# Patient Record
Sex: Female | Born: 2000 | Race: White | Hispanic: No | Marital: Single | Smoking: Current every day smoker
Health system: Southern US, Community
[De-identification: ages and names within clinical notes are randomized; demographics above are authoritative.]

## PROBLEM LIST (undated history)

## (undated) DIAGNOSIS — F988 Other specified behavioral and emotional disorders with onset usually occurring in childhood and adolescence: Secondary | ICD-10-CM

## (undated) DIAGNOSIS — F329 Major depressive disorder, single episode, unspecified: Secondary | ICD-10-CM

## (undated) DIAGNOSIS — F32A Depression, unspecified: Secondary | ICD-10-CM

## (undated) HISTORY — PX: NO PAST SURGERIES: SHX2092

---
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
## 2019-02-12 ENCOUNTER — Emergency Department
Admission: EM | Admit: 2019-02-12 | Discharge: 2019-02-12 | Disposition: A | Discharge status: Home / Self Care | Payer: Self-pay | Attending: Student in an Organized Health Care Education/Training Program | Admitting: Student in an Organized Health Care Education/Training Program

## 2019-02-12 ENCOUNTER — Encounter: Payer: Self-pay | Admitting: Emergency Medicine

## 2019-02-12 ENCOUNTER — Other Ambulatory Visit: Payer: Self-pay

## 2019-02-12 DIAGNOSIS — J111 Influenza due to unidentified influenza virus with other respiratory manifestations: Secondary | ICD-10-CM | POA: Insufficient documentation

## 2019-02-12 DIAGNOSIS — Z79899 Other long term (current) drug therapy: Secondary | ICD-10-CM | POA: Insufficient documentation

## 2019-02-12 DIAGNOSIS — R6889 Other general symptoms and signs: Secondary | ICD-10-CM

## 2019-02-12 DIAGNOSIS — F17200 Nicotine dependence, unspecified, uncomplicated: Secondary | ICD-10-CM | POA: Insufficient documentation

## 2019-02-12 LAB — GROUP A STREP BY PCR: Group A Strep by PCR: NOT DETECTED

## 2019-02-12 MED ORDER — BENZONATATE 100 MG PO CAPS: 200.0000 mg | ORAL_CAPSULE | Freq: Three times a day (TID) | ORAL | 0 refills | Status: AC | PRN

## 2019-02-12 MED ORDER — FEXOFENADINE-PSEUDOEPHED ER 60-120 MG PO TB12: 1.0000 | ORAL_TABLET | Freq: Two times a day (BID) | ORAL | 0 refills | Status: DC

## 2019-02-12 MED ORDER — LIDOCAINE VISCOUS HCL 2 % MT SOLN: 5.0000 mL | Freq: Four times a day (QID) | OROMUCOSAL | 0 refills | Status: DC | PRN

## 2019-02-12 NOTE — ED Triage Notes (Signed)
Presents with sore throat body aches and nausea  States sx's started last pm

## 2019-02-12 NOTE — ED Provider Notes (Signed)
[facility] Emergency Department Provider Note   ____________________________________________   First MD Initiated Contact with Patient 02/12/19 1339     (approximate)  I have reviewed the triage vital signs and the nursing notes.   HISTORY  Chief Complaint Generalized Body Aches    HPI Becky Bush is a 18 y.o. female patient presents with sore throat, body aches, and nausea.  Patient denies vomiting or diarrhea.  Onset of complaint was last night.  Patient has taken flu shot for this season.  Patient also complained of frontal headache but this is not new.  Patient rates her pain discomfort a 5/10.  No palliative measure for complaint.    Past Medical History:  Diagnosis Date  . Attention deficit disorder   . Depression     Patient Active Problem List   Diagnosis Date Noted  . Suicide attempt (HCC) 07/29/2012  . Depression 07/29/2012  . Anxiety 07/29/2012    Past Surgical History:  Procedure Laterality Date  . NO PAST SURGERIES      Prior to Admission medications   Medication Sig Start Date End Date Taking? Authorizing Provider  benzonatate (TESSALON PERLES) 100 MG capsule Take 2 capsules (200 mg total) by mouth 3 (three) times daily as needed. 02/12/19 02/12/20  Joni Reining, PA-C  citalopram (CELEXA) 20 MG tablet Take 1 tablet (20 mg total) by mouth daily after supper. 08/05/12 08/05/13  Winson, Louie Bun, NP  fexofenadine-pseudoephedrine (ALLEGRA-D) 60-120 MG 12 hr tablet Take 1 tablet by mouth 2 (two) times daily. 02/12/19   Joni Reining, PA-C  lidocaine (XYLOCAINE) 2 % solution Use as directed 5 mLs in the mouth or throat every 6 (six) hours as needed for mouth pain. 02/12/19   Joni Reining, PA-C  methylphenidate (CONCERTA) 18 MG CR tablet Take 1 tablet (18 mg total) by mouth 2 (two) times daily with breakfast and lunch. 08/05/12   Winson, Louie Bun, NP    Allergies Omnicef [cefdinir]  No family history on file.  Social  History Social History   Tobacco Use  . Smoking status: Current Every Day Smoker  . Smokeless tobacco: Never Used  Substance Use Topics  . Alcohol use: No  . Drug use: No    Review of Systems Constitutional: No fever/chills Eyes: No visual changes. ENT: Sore throat.  Nasal congestion facial pain. cardiovascular: Denies chest pain. Respiratory: Denies shortness of breath. Gastrointestinal: No abdominal pain.  No nausea, no vomiting.  No diarrhea.  No constipation. Genitourinary: Negative for dysuria. Musculoskeletal: Negative for back pain. Skin: Negative for rash. Neurological: Positive for headaches, but denies focal weakness or numbness. Psychiatric:  ADD, anxiety, and depression. Allergic/Immunilogical: Truman Hayward ____________________________________________   PHYSICAL EXAM:  VITAL SIGNS: ED Triage Vitals  Enc Vitals Group     BP 02/12/19 1320 123/74     Pulse Rate 02/12/19 1320 82     Resp 02/12/19 1320 20     Temp 02/12/19 1320 98.2 F (36.8 C)     Temp Source 02/12/19 1320 Oral     SpO2 02/12/19 1320 98 %     Weight 02/12/19 1321 120 lb (54.4 kg)     Height 02/12/19 1321 5\' 7"  (1.702 m)     Head Circumference --      Peak Flow --      Pain Score 02/12/19 1321 5     Pain Loc --      Pain Edu? --      Excl. in GC? --  Constitutional: Alert and oriented. Well appearing and in no acute distress. Eyes: Conjunctivae are normal. PERRL. EOMI. Head: Atraumatic. Nose: Left maxillary guarding and bilateral edematous nasal turbinates. Mouth/Throat: Mucous membranes are moist.  Oropharynx erythematous.  Postnasal drainage. Neck: No stridor.  Hematological/Lymphatic/Immunilogical: No cervical lymphadenopathy. Cardiovascular: Normal rate, regular rhythm. Grossly normal heart sounds.  Good peripheral circulation. Respiratory: Normal respiratory effort.  No retractions. Lungs CTAB. Gastrointestinal: Soft and nontender. No distention. No abdominal bruits. No CVA  tenderness. Genitourinary: Deferred Musculoskeletal: No lower extremity tenderness nor edema.  No joint effusions. Neurologic:  Normal speech and language. No gross focal neurologic deficits are appreciated. No gait instability. Skin:  Skin is warm, dry and intact. No rash noted. Psychiatric: Mood and affect are normal. Speech and behavior are normal.  ____________________________________________   LABS (all labs ordered are listed, but only abnormal results are displayed)  Labs Reviewed  GROUP A STREP BY PCR   ____________________________________________  EKG   ____________________________________________  RADIOLOGY  ED MD interpretation:    Official radiology report(s): No results found.  ____________________________________________   PROCEDURES  Procedure(s) performed (including Critical Care):  Procedures   ____________________________________________   INITIAL IMPRESSION / ASSESSMENT AND PLAN / ED COURSE  As part of my medical decision making, I reviewed the following data within the electronic MEDICAL RECORD NUMBER   Patient presents with sore throat, body aches and nausea.  Patient has taken a flu shot for this season.  Patient strep test was negative.  Patient physical exam is consistent with viral respiratory infection.  Patient given discharge care instruction advised take medication as directed.  Follow-up with PCP.        ____________________________________________   FINAL CLINICAL IMPRESSION(S) / ED DIAGNOSES  Final diagnoses:  Flu-like symptoms     ED Discharge Orders         Ordered    fexofenadine-pseudoephedrine (ALLEGRA-D) 60-120 MG 12 hr tablet  2 times daily     02/12/19 1448    benzonatate (TESSALON PERLES) 100 MG capsule  3 times daily PRN     02/12/19 1448    lidocaine (XYLOCAINE) 2 % solution  Every 6 hours PRN     02/12/19 1448           Note:  This document was prepared using Dragon voice recognition software and may  include unintentional dictation errors.    Joni Reining, PA-C 02/12/19 1452    Willy Eddy, MD 02/12/19 [phone]

## 2020-12-17 ENCOUNTER — Ambulatory Visit
Admission: EM | Admit: 2020-12-17 | Discharge: 2020-12-17 | Disposition: A | Discharge status: Home / Self Care | Payer: Medicaid Other | Attending: Family Medicine | Admitting: Family Medicine

## 2020-12-17 ENCOUNTER — Ambulatory Visit (INDEPENDENT_AMBULATORY_CARE_PROVIDER_SITE_OTHER): Payer: Medicaid Other

## 2020-12-17 ENCOUNTER — Other Ambulatory Visit: Payer: Self-pay

## 2020-12-17 DIAGNOSIS — R0602 Shortness of breath: Secondary | ICD-10-CM

## 2020-12-17 DIAGNOSIS — Z20822 Contact with and (suspected) exposure to covid-19: Secondary | ICD-10-CM | POA: Insufficient documentation

## 2020-12-17 DIAGNOSIS — J40 Bronchitis, not specified as acute or chronic: Secondary | ICD-10-CM | POA: Diagnosis not present

## 2020-12-17 DIAGNOSIS — R059 Cough, unspecified: Secondary | ICD-10-CM

## 2020-12-17 DIAGNOSIS — R0981 Nasal congestion: Secondary | ICD-10-CM | POA: Diagnosis not present

## 2020-12-17 DIAGNOSIS — F17200 Nicotine dependence, unspecified, uncomplicated: Secondary | ICD-10-CM | POA: Diagnosis not present

## 2020-12-17 DIAGNOSIS — F419 Anxiety disorder, unspecified: Secondary | ICD-10-CM | POA: Diagnosis not present

## 2020-12-17 DIAGNOSIS — Z881 Allergy status to other antibiotic agents status: Secondary | ICD-10-CM | POA: Diagnosis not present

## 2020-12-17 DIAGNOSIS — Z7952 Long term (current) use of systemic steroids: Secondary | ICD-10-CM | POA: Diagnosis not present

## 2020-12-17 DIAGNOSIS — Z79899 Other long term (current) drug therapy: Secondary | ICD-10-CM | POA: Insufficient documentation

## 2020-12-17 DIAGNOSIS — R052 Subacute cough: Secondary | ICD-10-CM | POA: Diagnosis not present

## 2020-12-17 DIAGNOSIS — Z283 Underimmunization status: Secondary | ICD-10-CM | POA: Insufficient documentation

## 2020-12-17 LAB — RESP PANEL BY RT-PCR (FLU A&B, COVID) ARPGX2
Influenza A by PCR: NEGATIVE
Influenza B by PCR: NEGATIVE
SARS Coronavirus 2 by RT PCR: NEGATIVE

## 2020-12-17 MED ORDER — BENZONATATE 100 MG PO CAPS: 200.0000 mg | ORAL_CAPSULE | Freq: Three times a day (TID) | ORAL | 0 refills | Status: DC

## 2020-12-17 MED ORDER — PROMETHAZINE-DM 6.25-15 MG/5ML PO SYRP: 5.0000 mL | ORAL_SOLUTION | Freq: Four times a day (QID) | ORAL | 0 refills | Status: DC | PRN

## 2020-12-17 NOTE — ED Triage Notes (Signed)
Pt is here with a cough and nasal congestion that started 6 weeks ago, pt has taken OTC meds and steriods to relieve discomfort. Pt states she has been to the doctor multiple times.

## 2020-12-17 NOTE — Discharge Instructions (Addendum)
Your test were negative today for Covid, flu, and your chest x-ray did not show any signs of pneumonia.  Continue to use albuterol inhaler as needed for any cough and shortness of breath.  Use the Tessalon Perles every 8 hours during the day as needed for cough and the promethazine DM cough syrup at bedtime as needed for cough and sleep.  Smoking can increase lung irritation and add to your cough and shortness of breath.  If your symptoms continue you need to follow-up with your primary care provider.

## 2020-12-17 NOTE — ED Provider Notes (Signed)
[facility]    CSN: [id number] Arrival date & time: 12/17/20  0801      History   Chief Complaint Chief Complaint  Patient presents with  . Cough  . Nasal Congestion    HPI Becky Bush is a 19 y.o. female.   HPI   19 year old female here for evaluation of cough and nasal congestion.  Patient reports that she lives in [place] and is appear visiting.  Patient reports that she has seen her physician in [place] 3 times for her symptoms and she has been diagnosed with bronchitis.  She was given albuterol inhaler which she says helps minimally and Tessalon Perles which she is out of.  Patient states that she was told that she had fluid in her lungs based on her physical exam but no chest x-ray was performed.  Patient is complaining of an intermittent productive cough, shortness of breath, wheezing, trouble sleeping due to her coughing, and a runny nose.  Patient denies fever, sinus pain or pressure, ear pain or pressure, changes to her sense of taste or smell, sore throat, or body aches.  Patient has not had her Covid or flu vaccines.  Patient does smoke marijuana 3-4 times a week for anxiety.  Patient had been vaping but states she stopped.  Past Medical History:  Diagnosis Date  . Attention deficit disorder   . Depression     Patient Active Problem List   Diagnosis Date Noted  . Suicide attempt (HCC) 07/29/2012  . Depression 07/29/2012  . Anxiety 07/29/2012    Past Surgical History:  Procedure Laterality Date  . NO PAST SURGERIES      OB History   No obstetric history on file.      Home Medications    Prior to Admission medications   Medication Sig Start Date End Date Taking? Authorizing Provider  benzonatate (TESSALON) 100 MG capsule Take 2 capsules (200 mg total) by mouth every 8 (eight) hours. 12/17/20  Yes Becky Augusta, NP  promethazine-dextromethorphan (PROMETHAZINE-DM) 6.25-15 MG/5ML syrup Take 5 mLs by mouth 4 (four) times  daily as needed. 12/17/20  Yes Becky Augusta, NP  citalopram (CELEXA) 20 MG tablet Take 1 tablet (20 mg total) by mouth daily after supper. 08/05/12 08/05/13  Winson, Louie Bun, NP  fexofenadine-pseudoephedrine (ALLEGRA-D) 60-120 MG 12 hr tablet Take 1 tablet by mouth 2 (two) times daily. 02/12/19   Joni Reining, PA-C  levofloxacin (LEVAQUIN) 750 MG tablet Take 750 mg by mouth daily. 09/23/20   [provider]  lidocaine (XYLOCAINE) 2 % solution Use as directed 5 mLs in the mouth or throat every 6 (six) hours as needed for mouth pain. 02/12/19   Joni Reining, PA-C  methylphenidate (CONCERTA) 18 MG CR tablet Take 1 tablet (18 mg total) by mouth 2 (two) times daily with breakfast and lunch. 08/05/12   Winson, Louie Bun, NP  predniSONE (STERAPRED UNI-PAK 48 TAB) 10 MG (48) TBPK tablet Take by mouth. 09/23/20   [provider]  PROAIR HFA 108 (90 Base) MCG/ACT inhaler Inhale into the lungs. 12/10/20   [provider]    Family History Family History  Problem Relation Age of Onset  . Healthy Mother   . Healthy Father     Social History Social History   Tobacco Use  . Smoking status: Current Every Day Smoker  . Smokeless tobacco: Never Used  Vaping Use  . Vaping Use: Former  Substance Use Topics  . Alcohol use: No  .  Drug use: Yes    Types: Marijuana     Allergies   Omnicef [cefdinir]   Review of Systems Review of Systems  Constitutional: Negative for activity change, appetite change and fever.  HENT: Positive for congestion and rhinorrhea. Negative for ear pain, sinus pressure, sinus pain and sore throat.   Respiratory: Positive for cough, shortness of breath and wheezing.   Cardiovascular: Negative for chest pain.  Musculoskeletal: Negative for arthralgias and myalgias.  Skin: Negative for rash.  Hematological: Negative.   Psychiatric/Behavioral: Negative.      Physical Exam Triage Vital Signs ED Triage Vitals  Enc Vitals Group     BP 12/17/20 0809  (!) 129/96     Pulse Rate 12/17/20 0809 (!) 116     Resp 12/17/20 0809 16     Temp 12/17/20 0809 98.2 F (36.8 C)     Temp Source 12/17/20 0809 Oral     SpO2 12/17/20 0809 96 %     Weight --      Height --      Head Circumference --      Peak Flow --      Pain Score 12/17/20 0808 0     Pain Loc --      Pain Edu? --      Excl. in GC? --    No data found.  Updated Vital Signs BP (!) 129/96 (BP Location: Left Arm)   Pulse (!) 116   Temp 98.2 F (36.8 C) (Oral)   Resp 16   SpO2 96%   Visual Acuity Right Eye Distance:   Left Eye Distance:   Bilateral Distance:    Right Eye Near:   Left Eye Near:    Bilateral Near:     Physical Exam Vitals and nursing note reviewed.  Constitutional:      General: She is not in acute distress.    Appearance: She is normal weight. She is not ill-appearing.  HENT:     Head: Normocephalic and atraumatic.     Right Ear: Tympanic membrane, ear canal and external ear normal.     Left Ear: Tympanic membrane, ear canal and external ear normal.     Nose: Congestion and rhinorrhea present.     Comments: Patient has mild erythema and edema to her nasal mucosa bilaterally.  There is clear nasal discharge present in scant amounts bilaterally.    Mouth/Throat:     Mouth: Mucous membranes are moist.     Pharynx: Oropharynx is clear. No oropharyngeal exudate or posterior oropharyngeal erythema.  Neck:     Comments: Patient has bilateral, anterior, shotty, nontender cervical lymphadenopathy. Cardiovascular:     Rate and Rhythm: Normal rate and regular rhythm.     Pulses: Normal pulses.     Heart sounds: Normal heart sounds. No murmur heard. No gallop.   Pulmonary:     Effort: Pulmonary effort is normal.     Breath sounds: Normal breath sounds. No wheezing, rhonchi or rales.  Musculoskeletal:     Cervical back: Normal range of motion and neck supple.  Lymphadenopathy:     Cervical: Cervical adenopathy present.  Skin:    General: Skin is warm  and dry.     Capillary Refill: Capillary refill takes less than 2 seconds.     Findings: No erythema or rash.  Neurological:     General: No focal deficit present.     Mental Status: She is alert and oriented to person, place, and time.  Psychiatric:  Mood and Affect: Mood normal.        Behavior: Behavior normal.        Thought Content: Thought content normal.        Judgment: Judgment normal.      UC Treatments / Results  Labs (all labs ordered are listed, but only abnormal results are displayed) Labs Reviewed  RESP PANEL BY RT-PCR (FLU A&B, COVID) [id number]    EKG   Radiology DG Chest 2 View  Result Date: 12/17/2020 CLINICAL DATA:  Shortness of breath.  Cough. EXAM: CHEST - 2 VIEW COMPARISON:  May 13, 2015. FINDINGS: The heart size and mediastinal contours are within normal limits. Both lungs are clear. No pneumothorax or pleural effusion is noted. The visualized skeletal structures are unremarkable. IMPRESSION: No active cardiopulmonary disease. Electronically Signed   By: Lupita Raider M.D.   On: 12/17/2020 08:49    Procedures Procedures (including critical care time)  Medications Ordered in UC Medications - No data to display  Initial Impression / Assessment and Plan / UC Course  I have reviewed the triage vital signs and the nursing notes.  Pertinent labs & imaging results that were available during my care of the patient were reviewed by me and considered in my medical decision making (see chart for details).   Patient is here for evaluation of cough this been going on for 6 weeks.  She has been treated with prednisone, Tessalon Perles, and albuterol without resolution or improvement.  Physical exam reveals a nontoxic 19 year old female who is not tachypneic, dyspneic, or in any acute distress.  Upper respiratory exam reveals some mildly erythematous and edematous nasal mucosa with clear nasal discharge.  Patient also has bilateral cervical lymphadenopathy.   Lung sounds are clear to auscultation in all fields.  Will obtain chest x-ray and respiratory panel collected at triage.  Chest x-ray is negative for any acute cardiopulmonary process.  Gwyndolyn Kaufman panel is negative for Covid and flu.  Will discharge patient home with a diagnosis of cough, will refill the Tessalon Perles, will give Promethazine DM for nighttime to help with cough and sleep, encourage her to continue using her albuterol inhaler, and stop smoking.    Final Clinical Impressions(s) / UC Diagnoses   Final diagnoses:  Cough     Discharge Instructions     Your test were negative today for Covid, flu, and your chest x-ray did not show any signs of pneumonia.  Continue to use albuterol inhaler as needed for any cough and shortness of breath.  Use the Tessalon Perles every 8 hours during the day as needed for cough and the promethazine DM cough syrup at bedtime as needed for cough and sleep.  Smoking can increase lung irritation and add to your cough and shortness of breath.  If your symptoms continue you need to follow-up with your primary care provider.    ED Prescriptions    Medication Sig Dispense Auth. Provider   benzonatate (TESSALON) 100 MG capsule Take 2 capsules (200 mg total) by mouth every 8 (eight) hours. 21 capsule Becky Augusta, NP   promethazine-dextromethorphan (PROMETHAZINE-DM) 6.25-15 MG/5ML syrup Take 5 mLs by mouth 4 (four) times daily as needed. 118 mL Becky Augusta, NP     PDMP not reviewed this encounter.   Becky Augusta, NP 12/17/20 1017

## 2021-03-01 ENCOUNTER — Observation Stay
Admission: EM | Admit: 2021-03-01 | Discharge: 2021-03-02 | Disposition: A | Discharge status: Home / Self Care | Payer: Medicaid Other | Attending: Internal Medicine | Admitting: Internal Medicine

## 2021-03-01 ENCOUNTER — Other Ambulatory Visit: Payer: Self-pay

## 2021-03-01 ENCOUNTER — Emergency Department: Payer: Medicaid Other

## 2021-03-01 ENCOUNTER — Encounter: Payer: Self-pay | Admitting: Emergency Medicine

## 2021-03-01 ENCOUNTER — Other Ambulatory Visit
Admission: RE | Admit: 2021-03-01 | Discharge: 2021-03-01 | Disposition: A | Discharge status: Home / Self Care | Payer: Medicaid Other | Source: Ambulatory Visit | Attending: *Deleted | Admitting: *Deleted

## 2021-03-01 DIAGNOSIS — J45901 Unspecified asthma with (acute) exacerbation: Principal | ICD-10-CM | POA: Insufficient documentation

## 2021-03-01 DIAGNOSIS — R079 Chest pain, unspecified: Secondary | ICD-10-CM | POA: Insufficient documentation

## 2021-03-01 DIAGNOSIS — R42 Dizziness and giddiness: Secondary | ICD-10-CM | POA: Insufficient documentation

## 2021-03-01 DIAGNOSIS — F17200 Nicotine dependence, unspecified, uncomplicated: Secondary | ICD-10-CM | POA: Insufficient documentation

## 2021-03-01 DIAGNOSIS — Z20822 Contact with and (suspected) exposure to covid-19: Secondary | ICD-10-CM | POA: Insufficient documentation

## 2021-03-01 DIAGNOSIS — R0602 Shortness of breath: Secondary | ICD-10-CM | POA: Insufficient documentation

## 2021-03-01 DIAGNOSIS — R000 Tachycardia, unspecified: Secondary | ICD-10-CM

## 2021-03-01 DIAGNOSIS — R059 Cough, unspecified: Secondary | ICD-10-CM | POA: Insufficient documentation

## 2021-03-01 DIAGNOSIS — J40 Bronchitis, not specified as acute or chronic: Secondary | ICD-10-CM | POA: Diagnosis not present

## 2021-03-01 LAB — RESP PANEL BY RT-PCR (FLU A&B, COVID) ARPGX2
Influenza A by PCR: NEGATIVE
Influenza B by PCR: NEGATIVE
SARS Coronavirus 2 by RT PCR: NEGATIVE

## 2021-03-01 LAB — URINE DRUG SCREEN, QUALITATIVE (ARMC ONLY)
Amphetamines, Ur Screen: NOT DETECTED
Barbiturates, Ur Screen: NOT DETECTED
Benzodiazepine, Ur Scrn: NOT DETECTED
Cannabinoid 50 Ng, Ur ~~LOC~~: POSITIVE — AB
Cocaine Metabolite,Ur ~~LOC~~: NOT DETECTED
MDMA (Ecstasy)Ur Screen: NOT DETECTED
Methadone Scn, Ur: NOT DETECTED
Opiate, Ur Screen: NOT DETECTED
Phencyclidine (PCP) Ur S: NOT DETECTED
Tricyclic, Ur Screen: NOT DETECTED

## 2021-03-01 LAB — TROPONIN I (HIGH SENSITIVITY)
Troponin I (High Sensitivity): <2 ng/L (ref <18)
Troponin I (High Sensitivity): <2 ng/L (ref <18)

## 2021-03-01 LAB — D-DIMER, QUANTITATIVE
D-Dimer, Quant: <0.27 ug/mL-FEU (ref 0.00–0.50)
D-Dimer, Quant: <0.27 ug/mL-FEU (ref 0.00–0.50)

## 2021-03-01 MED ORDER — PREDNISONE 20 MG PO TABS
40.0000 mg | ORAL_TABLET | Freq: Every day | ORAL | Status: DC
  Administered 2021-03-02: 40 mg via ORAL
  Filled 2021-03-01: qty 2

## 2021-03-01 MED ORDER — TRAZODONE HCL 50 MG PO TABS: 50.0000 mg | ORAL_TABLET | Freq: Every day | ORAL | Status: DC

## 2021-03-01 MED ORDER — IPRATROPIUM BROMIDE 0.02 % IN SOLN
0.5000 mg | Freq: Four times a day (QID) | RESPIRATORY_TRACT | Status: DC | PRN
  Administered 2021-03-02: 0.5 mg via RESPIRATORY_TRACT
  Filled 2021-03-01: qty 2.5

## 2021-03-01 MED ORDER — LEVALBUTEROL HCL 1.25 MG/0.5ML IN NEBU
1.2500 mg | INHALATION_SOLUTION | Freq: Four times a day (QID) | RESPIRATORY_TRACT | Status: DC | PRN
  Administered 2021-03-02: 1.25 mg via RESPIRATORY_TRACT
  Filled 2021-03-01 (×2): qty 0.5

## 2021-03-01 MED ORDER — HYDROXYZINE HCL 25 MG PO TABS
25.0000 mg | ORAL_TABLET | Freq: Once | ORAL | Status: AC
  Administered 2021-03-01: 25 mg via ORAL
  Filled 2021-03-01: qty 1

## 2021-03-01 MED ORDER — IOHEXOL 350 MG/ML SOLN
75.0000 mL | Freq: Once | INTRAVENOUS | Status: AC | PRN
  Administered 2021-03-01: 75 mL via INTRAVENOUS

## 2021-03-01 MED ORDER — IPRATROPIUM-ALBUTEROL 0.5-2.5 (3) MG/3ML IN SOLN
3.0000 mL | Freq: Once | RESPIRATORY_TRACT | Status: AC
  Administered 2021-03-01: 3 mL via RESPIRATORY_TRACT
  Filled 2021-03-01: qty 3

## 2021-03-01 MED ORDER — SODIUM CHLORIDE 0.9 % IV BOLUS
500.0000 mL | Freq: Once | INTRAVENOUS | Status: AC
  Administered 2021-03-01: 500 mL via INTRAVENOUS

## 2021-03-01 MED ORDER — METHYLPREDNISOLONE SODIUM SUCC 125 MG IJ SOLR: 125.0000 mg | Freq: Once | INTRAMUSCULAR | Status: DC

## 2021-03-01 MED ORDER — METOPROLOL TARTRATE 5 MG/5ML IV SOLN
5.0000 mg | Freq: Once | INTRAVENOUS | Status: AC
  Administered 2021-03-01: 5 mg via INTRAVENOUS
  Filled 2021-03-01: qty 5

## 2021-03-01 MED ORDER — ENOXAPARIN SODIUM 40 MG/0.4ML ~~LOC~~ SOLN
40.0000 mg | SUBCUTANEOUS | Status: DC
  Administered 2021-03-01: 40 mg via SUBCUTANEOUS
  Filled 2021-03-01: qty 0.4

## 2021-03-01 NOTE — ED Triage Notes (Addendum)
Pt via POV from home. Pt went to [facility] due to SOB that the pt states has been going on for over a year. Pt also c/o productive cough. Pt has a hx of asthma. Pt states that [facility] gave her 2 neb treatments and Solu-Medrol. Pt states she feels slightly better. On arrival, pt O2 sat 87% on RA and HR is 136. Pt does have a hx of Vaping but has quit for about couple months

## 2021-03-01 NOTE — ED Notes (Signed)
Pt placed in the room. During triage pt O2 sat 95% on 3L [place].

## 2021-03-01 NOTE — ED Provider Notes (Signed)
[facility] Emergency Department Provider Note ____________________________________________   Event Date/Time   First MD Initiated Contact with Patient 03/01/21 1507     (approximate)  I have reviewed the triage vital signs and the nursing notes.   HISTORY  Chief Complaint Shortness of Breath and Cough    HPI Becky Bush is a 20 y.o. female with PMH as noted below who presents with shortness of breath over approximately the last year, but worse in the last several weeks.  The patient states that she has had periods of shortness of breath and cough and then would be prescribed a steroid course and albuterol and it would improve.  She reports cough productive of greenish sputum.  She has had no fever.  She denies active chest pain.  The patient states that she went to urgent care today and got nebulizer treatments and a shot and then was sent to the ED.  She states that the symptoms started after she had been vaping, however she has not used a vape in the last several months.  Past Medical History:  Diagnosis Date  . Attention deficit disorder   . Depression     Patient Active Problem List   Diagnosis Date Noted  . Asthma exacerbation 03/01/2021  . Suicide attempt (HCC) 07/29/2012  . Depression 07/29/2012  . Anxiety 07/29/2012    Past Surgical History:  Procedure Laterality Date  . NO PAST SURGERIES      Prior to Admission medications   Medication Sig Start Date End Date Taking? Authorizing Provider  predniSONE (STERAPRED UNI-PAK 21 TAB) 10 MG (21) TBPK tablet Take 10-60 mg by mouth daily. 03/01/21   [provider]  PROAIR HFA 108 (90 Base) MCG/ACT inhaler Inhale into the lungs. 12/10/20   [provider]    Allergies Omnicef [cefdinir]  Family History  Problem Relation Age of Onset  . Healthy Mother   . Healthy Father     Social History Social History   Tobacco Use  . Smoking status: Current Every Day Smoker   . Smokeless tobacco: Never Used  Vaping Use  . Vaping Use: Former  Substance Use Topics  . Alcohol use: No  . Drug use: Yes    Types: Marijuana    Review of Systems  Constitutional: No fever/chills Eyes: No redness.. ENT: No sore throat. Cardiovascular: Denies chest pain. Respiratory: Positive for shortness of breath. Gastrointestinal: No vomiting or diarrhea.  Genitourinary: Negative for flank pain. Musculoskeletal: Negative for back pain. Skin: Negative for rash. Neurological: Negative for headache.   ____________________________________________   PHYSICAL EXAM:  VITAL SIGNS: ED Triage Vitals  Enc Vitals Group     BP 03/01/21 1436 113/88     Pulse Rate 03/01/21 1436 (!) 136     Resp 03/01/21 1436 20     Temp 03/01/21 1436 97.8 F (36.6 C)     Temp Source 03/01/21 1436 Oral     SpO2 03/01/21 1436 (!) 87 %     Weight 03/01/21 1439 105 lb (47.6 kg)     Height 03/01/21 1439 5\' 7"  (1.702 m)     Head Circumference --      Peak Flow --      Pain Score 03/01/21 1439 0     Pain Loc --      Pain Edu? --      Excl. in GC? --     Constitutional: Alert and oriented. Well appearing and in no acute distress. Eyes: Conjunctivae are  normal.  Head: Atraumatic. Nose: Mild nasal congestion. Mouth/Throat: Mucous membranes are moist.   Neck: Normal range of motion.  Cardiovascular: Tachycardic, regular rhythm. Grossly normal heart sounds.  Good peripheral circulation. Respiratory: Slightly increased respiratory effort.  No retractions. Lungs with faint scattered wheezes bilaterally. Gastrointestinal: No distention.  Musculoskeletal: No lower extremity edema.  Extremities warm and well perfused.  Neurologic:  Normal speech and language. No gross focal neurologic deficits are appreciated.  Skin:  Skin is warm and dry. No rash noted. Psychiatric: Anxious appearing and tearful; normal behavior.  ____________________________________________   LABS (all labs ordered are  listed, but only abnormal results are displayed)  ____________________________________________  EKG  ED ECG REPORT I, Dionne Bucy, the attending physician, personally viewed and interpreted this ECG.  Date: 03/01/2021 EKG Time: 1811 Rate: 164 Rhythm: normal sinus rhythm QRS Axis: normal Intervals: normal ST/T Wave abnormalities: Nonspecific ST abnormalities Narrative Interpretation: Sinus tachycardia with nonspecific abnormalities (likely rate related) but no evidence of acute ischemia  ____________________________________________  RADIOLOGY  Chest x-ray interpreted by me shows no focal infiltrate or edema  ____________________________________________   PROCEDURES  Procedure(s) performed: No  Procedures  Critical Care performed: No ____________________________________________   INITIAL IMPRESSION / ASSESSMENT AND PLAN / ED COURSE  Pertinent labs & imaging results that were available during my care of the patient were reviewed by me and considered in my medical decision making (see chart for details).  20 year old female with PMH as noted above including a history of asthma as a child presents with repeated episodes of shortness of breath, cough, and wheezing over the last year, with the current episode occurring over the last several weeks.  She went to urgent care and was found to be hypoxic after steroid and nebulizer treatments.  I reviewed the past medical records in Epic and Care Everywhere.  The patient was seen at urgent care today.  CBC from today is normal with no leukocytosis.  BMP is unremarkable.  Apparently a D-dimer was sent but I do not see a result.  She was last seen in urgent care on 12/31 for similar symptoms.  She is not vaccinated for Covid.  On exam the patient is somewhat anxious and tearful appearing but in no acute distress.  She is tachypneic and tachycardic.  O2 saturation is in the low to mid 90s on 2 L by nasal cannula.  Room air O2  saturation was in the high 80s.  There are some scattered wheezing on lung exam.  Differential includes acute and/or chronic bronchitis, other chronic lung disease, pneumonia, asthma, COVID-19, or less likely PE or cardiac cause.  We will obtain a chest x-ray, troponin, D-dimer, continue bronchodilators and reassess.  ----------------------------------------- 7:42 PM on 03/01/2021 -----------------------------------------  The patient's shortness of breath has significantly improved.  We were able to take her off of the oxygen, and her O2 sat is now around 93 to 94% on room air.  She was able to ambulate around the room without a significant drop in O2 saturation.  However, for the last several hours the patient has been significantly tachycardic to as high as the 160s to 170s.  EKG shows a sinus rhythm.  The patient denies any acute symptoms related to this, but who is now persisted for several hours despite an additional liter of normal saline.  Overall I suspect that this is likely related to the albuterol and should resolve, but the patient will require additional monitoring hospital.  Also despite the normal D-dimer, we will  obtain a CT angio of the chest to rule out PE as a cause of this persistent tachycardia.  I consulted Dr. Cyndia Bent from the hospitalist service for admission __________________________  Griffin Dakin was evaluated in Emergency Department on 03/01/2021 for the symptoms described in the history of present illness. She was evaluated in the context of the global COVID-19 pandemic, which necessitated consideration that the patient might be at risk for infection with the SARS-CoV-2 virus that causes COVID-19. Institutional protocols and algorithms that pertain to the evaluation of patients at risk for COVID-19 are in a state of rapid change based on information released by regulatory bodies including the CDC and federal and state organizations. These policies and algorithms were  followed during the patient's care in the ED. ____________________________________________   FINAL CLINICAL IMPRESSION(S) / ED DIAGNOSES  Final diagnoses:  Bronchitis  Sinus tachycardia      NEW MEDICATIONS STARTED DURING THIS VISIT:  New Prescriptions   No medications on file     Note:  This document was prepared using Dragon voice recognition software and may include unintentional dictation errors.   Dionne Bucy, MD 03/01/21 1944

## 2021-03-01 NOTE — H&P (Signed)
History and Physical    Becky Bush XFG:[id number] DOB: 26-Sep-2001 DOA: 03/01/2021  PCP: Becky Bush, No Pcp Per  Becky Bush coming from: Home, mom at bedside  I have personally briefly reviewed Becky Bush's old medical records in [facility]  Chief Complaint: shortness of breath   HPI: Becky Bush is a 20 y.o. female with medical history significant for asthma, depression/anxiety and suicide attempt who presents with concerns of increasing shortness of breath and wheezing.  Becky Bush tearful and anxious during exam.  States that for the past year she has been having on and off exacerbation of her asthma.  She would receive steroid tapers and albuterol which will help resolve it but then they would recur.  Previously they were triggered by her vaping which she has since quit.  States albuterol is her only inhaler and she takes it about every 6 hours and does it almost daily.    Today she had acute sudden onset of shortness of breath and wheezing and presented to urgent care and was ultimately sent to ED for hypoxia. She received 2 neb treatments and Solu-Medrol prior to arrival.  She was initially on O2 with a saturation of 87% but has since weaned back off to room air.  She received an additional 2 DuoNeb treatments here in the ED.  On arrival from urgent care her heart rate was about 130s but after further duo nebs this is increased to about 170.  Her respiratory status significantly improve but hospitalist was called for admission to monitor significant sinus tachycardia.  She denies vaping but has occasional marijuana use.  Denies any cocaine use.  Review of Systems: Constitutional: No Weight Change, No Fever ENT/Mouth: No sore throat, No Rhinorrhea Eyes: No Eye Pain, No Vision Changes Cardiovascular: No Chest Pain, + SOB, No PND, + Dyspnea on Exertion, No Orthopnea,  No Edema, + Palpitations Respiratory: + Cough, No Sputum, No Wheezing, no Dyspnea  Gastrointestinal: No  Nausea, No Vomiting, No Diarrhea, No Constipation, No Pain Genitourinary: no Urinary Incontinence Musculoskeletal: No Arthralgias, No Myalgias Skin: No Skin Lesions, No Pruritus, Neuro: no Weakness, No Numbness Psych: No Anxiety/Panic, No Depression, no decrease appetite Heme/Lymph: No Bruising, No Bleeding  Past Medical History:  Diagnosis Date  . Attention deficit disorder   . Depression     Past Surgical History:  Procedure Laterality Date  . NO PAST SURGERIES       reports that she has been smoking. She has never used smokeless tobacco. She reports current drug use. Drug: Marijuana. She reports that she does not drink alcohol. Social History  Allergies  Allergen Reactions  . Omnicef [Cefdinir] Hives  NKDA- mom verified she does NOT have allergies to this  Family History  Problem Relation Age of Onset  . Healthy Mother   . Healthy Father      Prior to Admission medications   Medication Sig Start Date End Date Taking? Authorizing Provider  predniSONE (STERAPRED UNI-PAK 21 TAB) 10 MG (21) TBPK tablet Take 10-60 mg by mouth daily. 03/01/21   [provider]  PROAIR HFA 108 (90 Base) MCG/ACT inhaler Inhale into the lungs. 12/10/20   [provider]    Physical Exam: Vitals:   03/01/21 1730 03/01/21 1830 03/01/21 1845 03/01/21 1900  BP: 107/72 (!) 127/91    Pulse: (!) 130 (!) 166 (!) 170 (!) 164  Resp: (!) 26 19 (!) 34 (!) 23  Temp:      TempSrc:      SpO2:  93% 93% 97% 94%  Weight:      Height:        Constitutional: NAD, anxious tearful young female sitting up in bed Vitals:   03/01/21 1730 03/01/21 1830 03/01/21 1845 03/01/21 1900  BP: 107/72 (!) 127/91    Pulse: (!) 130 (!) 166 (!) 170 (!) 164  Resp: (!) 26 19 (!) 34 (!) 23  Temp:      TempSrc:      SpO2: 93% 93% 97% 94%  Weight:      Height:       Eyes: PERRL, lids and conjunctivae normal ENMT: Mucous membranes are moist.  Neck: normal, supple Respiratory: clear to auscultation  bilaterally, no crackles. Faint wheezing at bases. Normal respiratory effort on room air. No accessory muscle use.  Cardiovascular: sinus tachycardia on telemetry, no murmurs / rubs / gallops. No extremity edema.  Abdomen: no tenderness, no masses palpated.  Bowel sounds positive.  Musculoskeletal: no clubbing / cyanosis. No joint deformity upper and lower extremities. Good ROM, no contractures. Normal muscle tone.  Skin: no rashes, lesions, ulcers. No induration Neurologic: CN 2-12 grossly intact. Sensation intact. Strength 5/5 in all 4.  Psychiatric: Normal judgment and insight. Alert and oriented x 3. Normal mood.     Labs on Admission: I have personally reviewed following labs and imaging studies  CBC: No results for input(s): WBC, NEUTROABS, HGB, HCT, MCV, PLT in the last 168 hours. Basic Metabolic Panel: No results for input(s): NA, K, CL, CO2, GLUCOSE, BUN, CREATININE, CALCIUM, MG, PHOS in the last 168 hours. GFR: CrCl cannot be calculated (Becky Bush's most recent lab result is older than the maximum 21 days allowed.). Liver Function Tests: No results for input(s): AST, ALT, ALKPHOS, BILITOT, PROT, ALBUMIN in the last 168 hours. No results for input(s): LIPASE, AMYLASE in the last 168 hours. No results for input(s): AMMONIA in the last 168 hours. Coagulation Profile: No results for input(s): INR, PROTIME in the last 168 hours. Cardiac Enzymes: No results for input(s): CKTOTAL, CKMB, CKMBINDEX, TROPONINI in the last 168 hours. BNP (last 3 results) No results for input(s): PROBNP in the last 8760 hours. HbA1C: No results for input(s): HGBA1C in the last 72 hours. CBG: No results for input(s): GLUCAP in the last 168 hours. Lipid Profile: No results for input(s): CHOL, HDL, LDLCALC, TRIG, CHOLHDL, LDLDIRECT in the last 72 hours. Thyroid Function Tests: No results for input(s): TSH, T4TOTAL, FREET4, T3FREE, THYROIDAB in the last 72 hours. Anemia Panel: No results for input(s):  VITAMINB12, FOLATE, FERRITIN, TIBC, IRON, RETICCTPCT in the last 72 hours. Urine analysis:    Component Value Date/Time   COLORURINE YELLOW 07/18/2012 2323   APPEARANCEUR CLOUDY (A) 07/18/2012 2323   LABSPEC 1.019 07/18/2012 2323   PHURINE 6.0 07/18/2012 2323   GLUCOSEU NEGATIVE 07/18/2012 2323   HGBUR TRACE (A) 07/18/2012 2323   BILIRUBINUR NEGATIVE 07/18/2012 2323   KETONESUR NEGATIVE 07/18/2012 2323   PROTEINUR NEGATIVE 07/18/2012 2323   UROBILINOGEN 0.2 07/18/2012 2323   NITRITE NEGATIVE 07/18/2012 2323   LEUKOCYTESUR NEGATIVE 07/18/2012 2323    Radiological Exams on Admission: CT Angio Chest PE W and/or Wo Contrast  Result Date: 03/01/2021 CLINICAL DATA:  Chronic shortness of breath, productive cough EXAM: CT ANGIOGRAPHY CHEST WITH CONTRAST TECHNIQUE: Multidetector CT imaging of the chest was performed using the standard protocol during bolus administration of intravenous contrast. Multiplanar CT image reconstructions and MIPs were obtained to evaluate the vascular anatomy. CONTRAST:  93mL OMNIPAQUE IOHEXOL 350 MG/ML SOLN COMPARISON:  03/01/2021 FINDINGS: Cardiovascular: This is a technically adequate evaluation of the pulmonary vasculature. There are no filling defects or pulmonary emboli. Heart is unremarkable without pericardial effusion. No evidence of thoracic aortic aneurysm or dissection. Mediastinum/Nodes: No enlarged mediastinal, hilar, or axillary lymph nodes. Thyroid gland, trachea, and esophagus demonstrate no significant findings. Lungs/Pleura: There is basilar predominant bronchial wall thickening with scattered areas of mucous plugging, consistent with reactive airway disease or bronchitis. No airspace disease, effusion, or pneumothorax. Upper Abdomen: No acute abnormality. Musculoskeletal: No acute or destructive bony lesions. Reconstructed images demonstrate no additional findings. Review of the MIP images confirms the above findings. IMPRESSION: 1. No evidence of pulmonary  embolus. 2. Bibasilar predominant bronchial wall thickening, with scattered areas of mucous plugging, consistent with reactive airway disease or bronchitis. No acute airspace disease. Electronically Signed   By: Sharlet Salina M.D.   On: 03/01/2021 19:44   DG Chest Portable 1 View  Result Date: 03/01/2021 CLINICAL DATA:  Dyspnea. EXAM: PORTABLE CHEST 1 VIEW COMPARISON:  December 17, 2020. FINDINGS: The heart size and mediastinal contours are within normal limits. Both lungs are clear. No pneumothorax or pleural effusion is noted. The visualized skeletal structures are unremarkable. IMPRESSION: No active disease. Electronically Signed   By: Lupita Raider M.D.   On: 03/01/2021 15:25      Assessment/Plan  Acute asthma exacerbation w/hx of uncontrol asthma ipratropium-levoalbuterol neb q6 PRN rather than scheduled since symptoms largely resolved Transition to oral prednisone tomorrow- has received solu-medrol at Urgent care prior to arrival  pt needs symbicort daily inhaler rather than just albuterol going forward need close follow up with PCP  Sinus tacycardia CTA chest negative  suspect mostly due to albuterol use  keep one time dose of 5mg  IV Lopressor  Keep on continuous telemetry Obtain UDS- she admits to marijuana use  DVT prophylaxis:.Lovenox Code Status: Full Family Communication: Plan discussed with Becky Bush and mom at bedside  disposition Plan: Home with observation Consults called:  Admission status: Observation  Level of care: Progressive Cardiac  Status is: Observation  The Becky Bush remains OBS appropriate and will d/c before 2 midnights.  Dispo: The Becky Bush is from: Home              Anticipated d/c is to: Home              Becky Bush currently is not medically stable to d/c.   Difficult to place Becky Bush No         DO [facility]   If 7PM-7AM, please contact night-coverage [email or web address]   03/01/2021, 9:19 PM

## 2021-03-01 NOTE — ED Notes (Signed)
Per MD request, ambulated patient with 02 monitoring approximately 200 feet. Lowest 02 sat was 90%. HR 125-135. Patient denies SOB. No acute distress.

## 2021-03-01 NOTE — ED Notes (Signed)
Patient reports anxiety and states she will be unable to sleep. MD notified, awaiting further orders.

## 2021-03-01 NOTE — ED Notes (Signed)
Patient refusing bloodwork until mother can come back (awaiting covid results).

## 2021-03-01 NOTE — ED Notes (Signed)
Repeat trop cancelled per MD request.

## 2021-03-02 DIAGNOSIS — J45901 Unspecified asthma with (acute) exacerbation: Secondary | ICD-10-CM | POA: Diagnosis not present

## 2021-03-02 DIAGNOSIS — J40 Bronchitis, not specified as acute or chronic: Secondary | ICD-10-CM | POA: Diagnosis not present

## 2021-03-02 DIAGNOSIS — R000 Tachycardia, unspecified: Secondary | ICD-10-CM

## 2021-03-02 LAB — CBC
HCT: 40.4 % (ref 36.0–46.0)
Hemoglobin: 14.1 g/dL (ref 12.0–15.0)
MCH: 33.5 pg (ref 26.0–34.0)
MCHC: 34.9 g/dL (ref 30.0–36.0)
MCV: 96 fL (ref 80.0–100.0)
Platelets: 447 10*3/uL — ABNORMAL HIGH (ref 150–400)
RBC: 4.21 MIL/uL (ref 3.87–5.11)
RDW: 12.2 % (ref 11.5–15.5)
WBC: 8.3 10*3/uL (ref 4.0–10.5)
nRBC: 0 % (ref 0.0–0.2)

## 2021-03-02 LAB — BASIC METABOLIC PANEL
Anion gap: 8 (ref 5–15)
BUN: 7 mg/dL (ref 6–20)
CO2: 21 mmol/L — ABNORMAL LOW (ref 22–32)
Calcium: 9.3 mg/dL (ref 8.9–10.3)
Chloride: 111 mmol/L (ref 98–111)
Creatinine, Ser: 0.57 mg/dL (ref 0.44–1.00)
GFR, Estimated: >60 mL/min (ref >60)
Glucose, Bld: 83 mg/dL (ref 70–99)
Potassium: 3.8 mmol/L (ref 3.5–5.1)
Sodium: 140 mmol/L (ref 135–145)

## 2021-03-02 LAB — HIV ANTIBODY (ROUTINE TESTING W REFLEX): HIV Screen 4th Generation wRfx: NONREACTIVE

## 2021-03-02 NOTE — Discharge Summary (Signed)
[place] at [facility]   PATIENT NAME: Becky Bush    MR#:  [id number]  DATE OF BIRTH:  11-06-2001  DATE OF ADMISSION:  03/01/2021   ADMITTING PHYSICIAN: Anselm Jungling, DO  DATE OF DISCHARGE: 03/02/2021 10:51 AM  PRIMARY CARE PHYSICIAN: Patient, No Pcp Per   ADMISSION DIAGNOSIS:  Sinus tachycardia [R00.0] Bronchitis [J40] Asthma exacerbation [J45.901] DISCHARGE DIAGNOSIS:  Principal Problem:   Asthma exacerbation Active Problems:   Sinus tachycardia   Bronchitis  SECONDARY DIAGNOSIS:   Past Medical History:  Diagnosis Date  . Attention deficit disorder   . Depression    HOSPITAL COURSE:  20 y.o. female with medical history significant for asthma, depression/anxiety and suicide attempt admitted for asthma exacerbation.  Acute asthma exacerbation w/hx of uncontrol asthma Improved rapidly with steroids and nebs. She prefers to only keep her albuterol inhaler and wants to go home as se feels back to normal. She also has steroids at home (from before that she still hasn't used.  Sinus tacycardia CTA chest negative   due to albuterol use   Marijuana use: UDS +    DISCHARGE CONDITIONS:  stable CONSULTS OBTAINED:   DRUG ALLERGIES:  No Active Allergies DISCHARGE MEDICATIONS:   Allergies as of 03/02/2021   No Active Allergies     Medication List    TAKE these medications   predniSONE 10 MG (21) Tbpk tablet Commonly known as: STERAPRED UNI-PAK 21 TAB Take 10-60 mg by mouth daily.   ProAir HFA 108 (90 Base) MCG/ACT inhaler Generic drug: albuterol Inhale into the lungs.      DISCHARGE INSTRUCTIONS:   DIET:  Regular diet DISCHARGE CONDITION:  Stable ACTIVITY:  Activity as tolerated OXYGEN:  Home Oxygen: No.  Oxygen Delivery: room air DISCHARGE LOCATION:  home   If you experience worsening of your admission symptoms, develop shortness of breath, life threatening emergency, suicidal or homicidal thoughts you must seek medical  attention immediately by calling 911 or calling your MD immediately  if symptoms less severe.  You Must read complete instructions/literature along with all the possible adverse reactions/side effects for all the Medicines you take and that have been prescribed to you. Take any new Medicines after you have completely understood and accpet all the possible adverse reactions/side effects.   Please note  You were cared for by a hospitalist during your hospital stay. If you have any questions about your discharge medications or the care you received while you were in the hospital after you are discharged, you can call the unit and asked to speak with the hospitalist on call if the hospitalist that took care of you is not available. Once you are discharged, your primary care physician will handle any further medical issues. Please note that NO REFILLS for any discharge medications will be authorized once you are discharged, as it is imperative that you return to your primary care physician (or establish a relationship with a primary care physician if you do not have one) for your aftercare needs so that they can reassess your need for medications and monitor your lab values.    On the day of Discharge:  VITAL SIGNS:  Blood pressure 127/78, pulse (!) 122, temperature 98 F (36.7 C), temperature source Oral, resp. rate 17, height 5\' 7"  (1.702 m), weight 47.6 kg, last menstrual period 02/22/2021, SpO2 95 %. PHYSICAL EXAMINATION:  GENERAL:  20 y.o.-year-old patient lying in the bed with no acute distress.  EYES: Pupils equal, round, reactive to light and  accommodation. No scleral icterus. Extraocular muscles intact.  HEENT: Head atraumatic, normocephalic. Oropharynx and nasopharynx clear.  NECK:  Supple, no jugular venous distention. No thyroid enlargement, no tenderness.  LUNGS: Normal breath sounds bilaterally, no wheezing, rales,rhonchi or crepitation. No use of accessory muscles of respiration.   CARDIOVASCULAR: S1, S2 normal. No murmurs, rubs, or gallops.  ABDOMEN: Soft, non-tender, non-distended. Bowel sounds present. No organomegaly or mass.  EXTREMITIES: No pedal edema, cyanosis, or clubbing.  NEUROLOGIC: Cranial nerves II through XII are intact. Muscle strength 5/5 in all extremities. Sensation intact. Gait not checked.  PSYCHIATRIC: The patient is alert and oriented x 3.  SKIN: No obvious rash, lesion, or ulcer.  DATA REVIEW:   CBC Recent Labs  Lab 03/02/21 0422  WBC 8.3  HGB 14.1  HCT 40.4  PLT 447*    Chemistries  Recent Labs  Lab 03/02/21 0422  NA 140  K 3.8  CL 111  CO2 21*  GLUCOSE 83  BUN 7  CREATININE 0.57  CALCIUM 9.3     Outpatient follow-up      Management plans discussed with the patient, nursing and they are in agreement.  CODE STATUS: Prior   TOTAL TIME TAKING CARE OF THIS PATIENT: 45 minutes.    Delfino Lovett M.D on 03/02/2021 at 7:38 PM  [facility]   CC: Primary care physician; Patient, No Pcp Per   Note: This dictation was prepared with Dragon dictation along with smaller phrase technology. Any transcriptional errors that result from this process are unintentional.

## 2021-03-02 NOTE — Discharge Instructions (Signed)

## 2021-03-02 NOTE — Progress Notes (Signed)
Becky Bush to be D/C'd Home per MD order.  Discussed prescriptions and follow up appointments with the patient. No Prescriptions given to patient, medication list explained in detail. Pt verbalized understanding.  Allergies as of 03/02/2021   No Active Allergies     Medication List    TAKE these medications   predniSONE 10 MG (21) Tbpk tablet Commonly known as: STERAPRED UNI-PAK 21 TAB Take 10-60 mg by mouth daily.   ProAir HFA 108 (90 Base) MCG/ACT inhaler Generic drug: albuterol Inhale into the lungs.       Vitals:   03/02/21 0927 03/02/21 0949  BP: 127/78   Pulse: (!) 122   Resp: 18 17  Temp: 98 F (36.7 C)   SpO2: 95%     Skin clean, dry and intact without evidence of skin break down, no evidence of skin tears noted. IV catheter discontinued intact. Site without signs and symptoms of complications. Dressing and pressure applied. Pt denies pain at this time. No complaints noted.  An After Visit Summary was printed and given to the patient. Patient escorted via WC, and D/C home via private auto.  Chasyn Cinque Marylou Flesher

## 2022-09-22 IMAGING — CT CT ANGIO CHEST
2 of 6 series · 19 of 46 positions shown · IV contrast (APPLIED)
Comparison: 03/01/2021

CLINICAL DATA: Chronic shortness of breath, productive cough

EXAM:
CT ANGIOGRAPHY CHEST WITH CONTRAST
TECHNIQUE: Multidetector CT imaging of the chest was performed using the
standard protocol during bolus administration of intravenous
contrast. Multiplanar CT image reconstructions and MIPs were
obtained to evaluate the vascular anatomy.
CONTRAST:  75mL OMNIPAQUE IOHEXOL 350 MG/ML SOLN

[Series 5: thins · axial · 0.60mm/px · z∈[+74,+368]mm · 16 of 324 slices shown]
[im 15/324  lung]
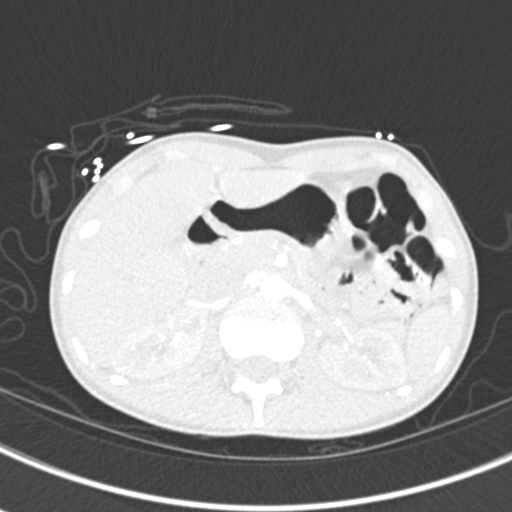
[im 43/324  soft-tissue]
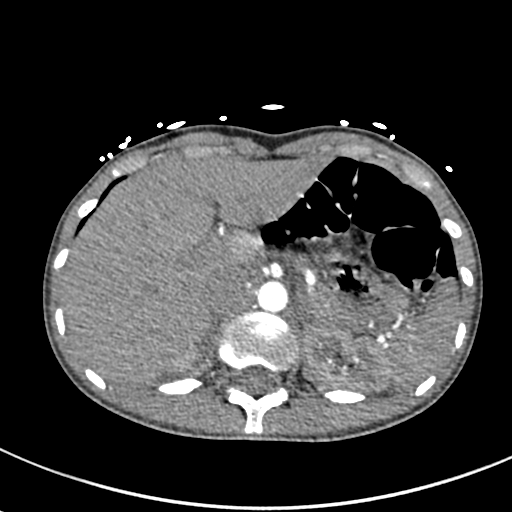
[im 57/324  lung]
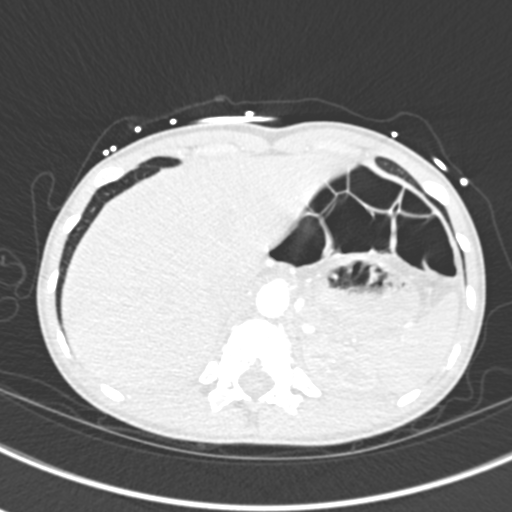
[im 71/324  soft-tissue]
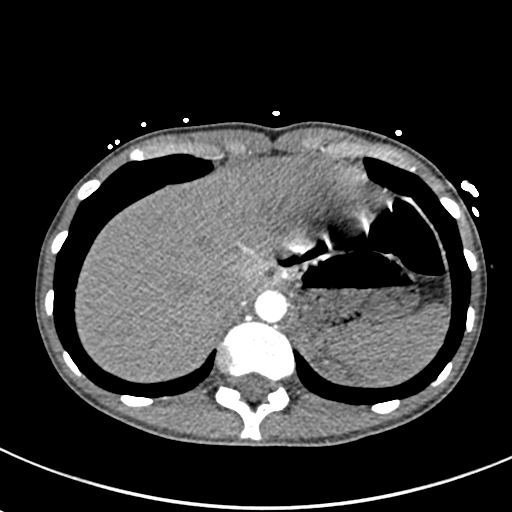
[im 99/324  lung]
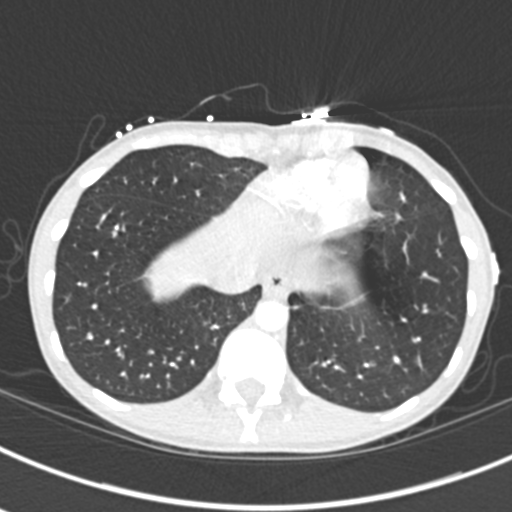
[im 113/324  soft-tissue]
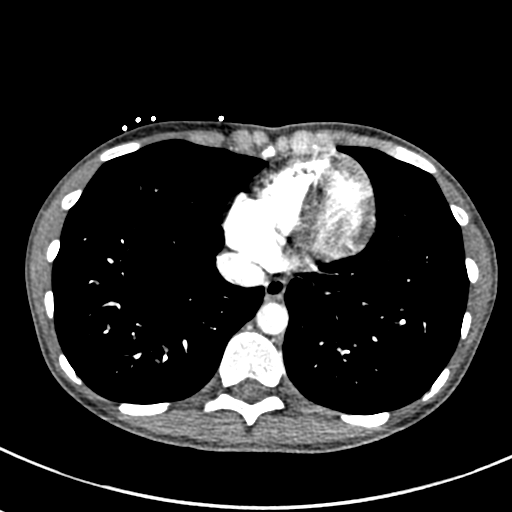
[im 127/324  lung]
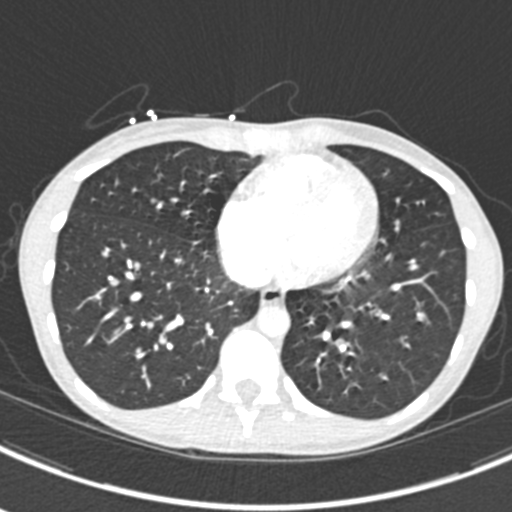
[im 155/324  soft-tissue]
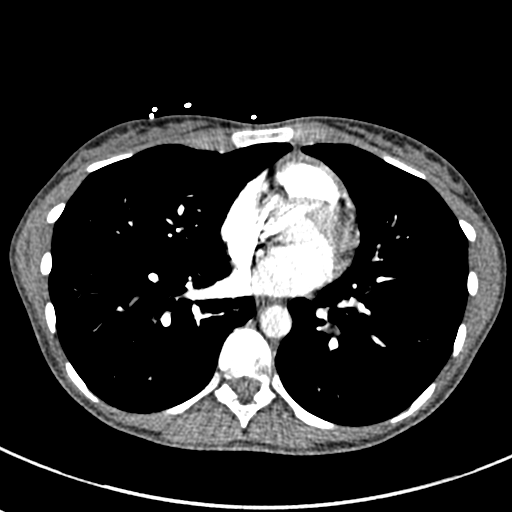
[im 169/324  lung]
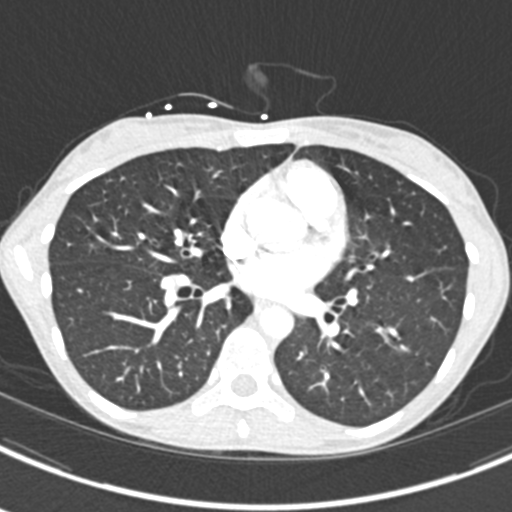
[im 197/324  soft-tissue]
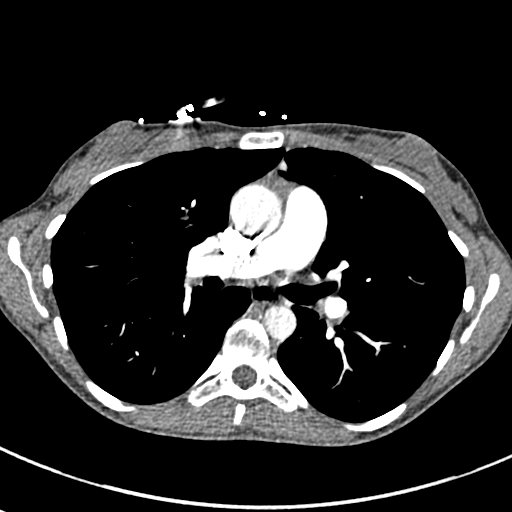
[im 211/324  lung]
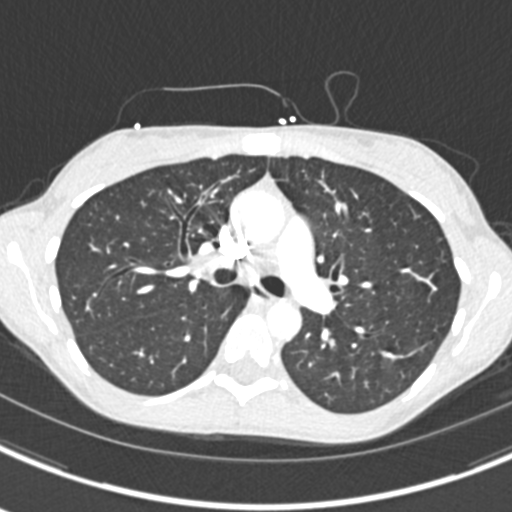
[im 225/324  soft-tissue]
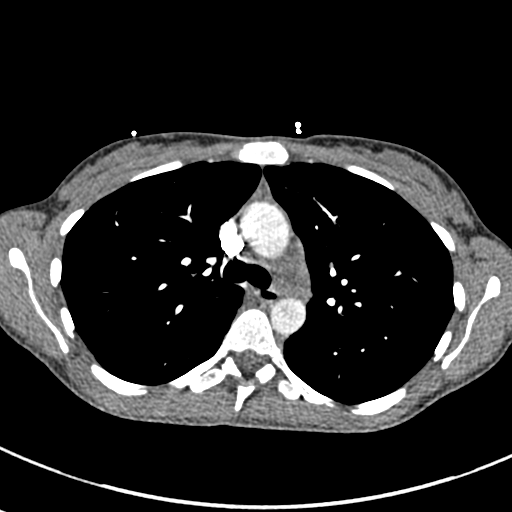
[im 253/324  lung]
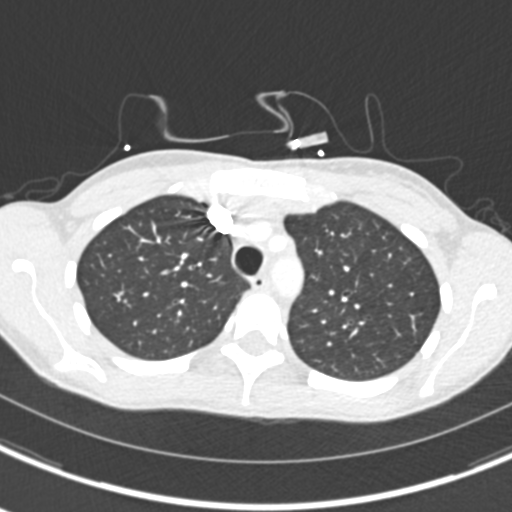
[im 267/324  soft-tissue]
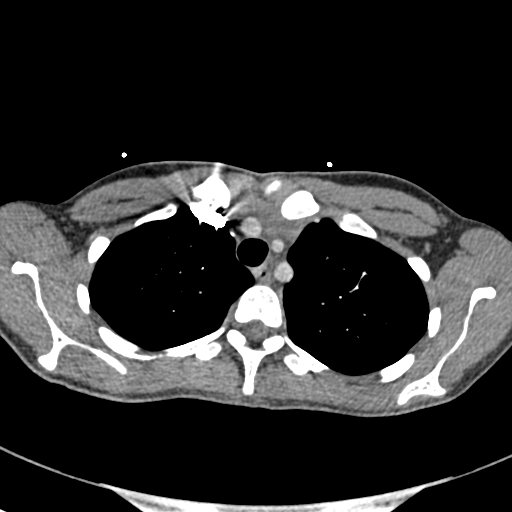
[im 281/324  lung]
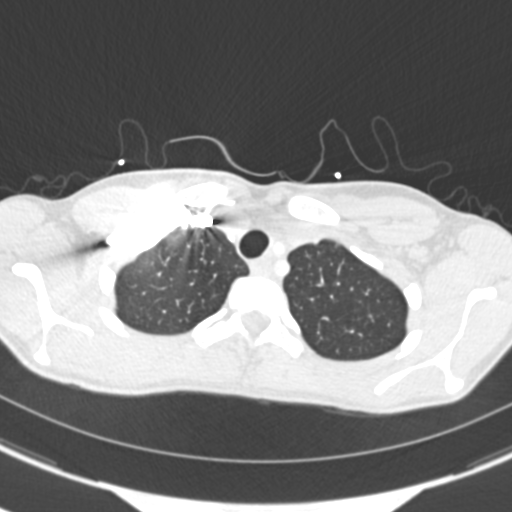
[im 309/324  soft-tissue]
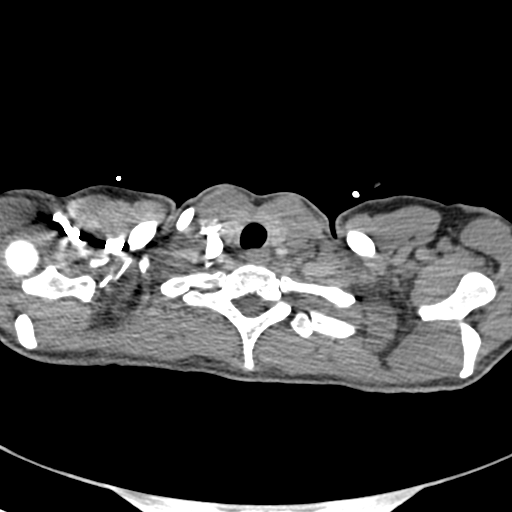

[Series 7: coronal mpr · coronal · 0.65mm/px · 3 of 68 slices shown]
[im 17/68  soft-tissue]
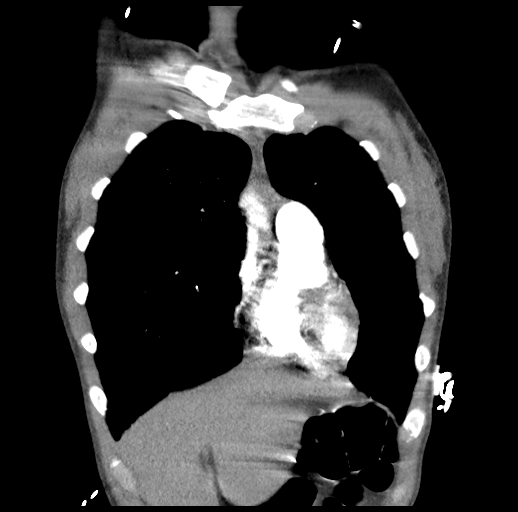
[im 34/68  soft-tissue]
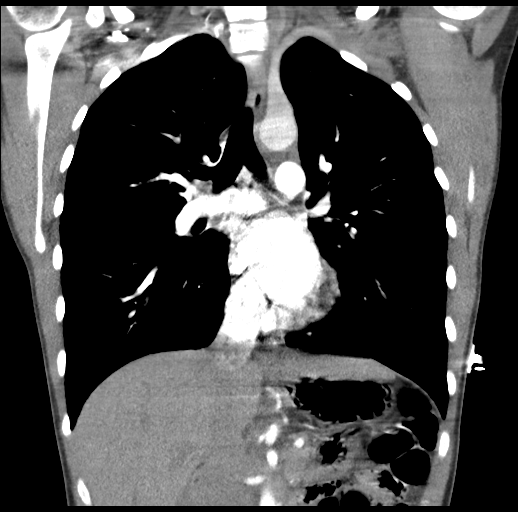
[im 51/68  soft-tissue]
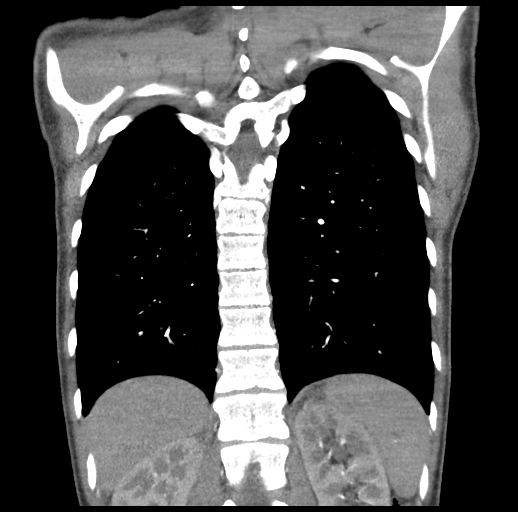

[19 of 46 positions shown; findings below may reference images not displayed]

FINDINGS: Cardiovascular: This is a technically adequate evaluation of the
pulmonary vasculature. There are no filling defects or pulmonary
emboli.

Heart is unremarkable without pericardial effusion. No evidence of
thoracic aortic aneurysm or dissection.

Mediastinum/Nodes: No enlarged mediastinal, hilar, or axillary lymph
nodes. Thyroid gland, trachea, and esophagus demonstrate no
significant findings.

Lungs/Pleura: There is basilar predominant bronchial wall thickening
with scattered areas of mucous plugging, consistent with reactive
airway disease or bronchitis. No airspace disease, effusion, or
pneumothorax.

Upper Abdomen: No acute abnormality.

Musculoskeletal: No acute or destructive bony lesions. Reconstructed
images demonstrate no additional findings.

Review of the MIP images confirms the above findings.
IMPRESSION: 1. No evidence of pulmonary embolus.
2. Bibasilar predominant bronchial wall thickening, with scattered
areas of mucous plugging, consistent with reactive airway disease or
bronchitis. No acute airspace disease.

## 2022-09-22 IMAGING — DX DG CHEST 1V PORT
1 series · 1 of 1 positions shown · non-contrast
Comparison: December 17, 2020.

CLINICAL DATA: Dyspnea.

EXAM:
PORTABLE CHEST 1 VIEW

[chest ap]
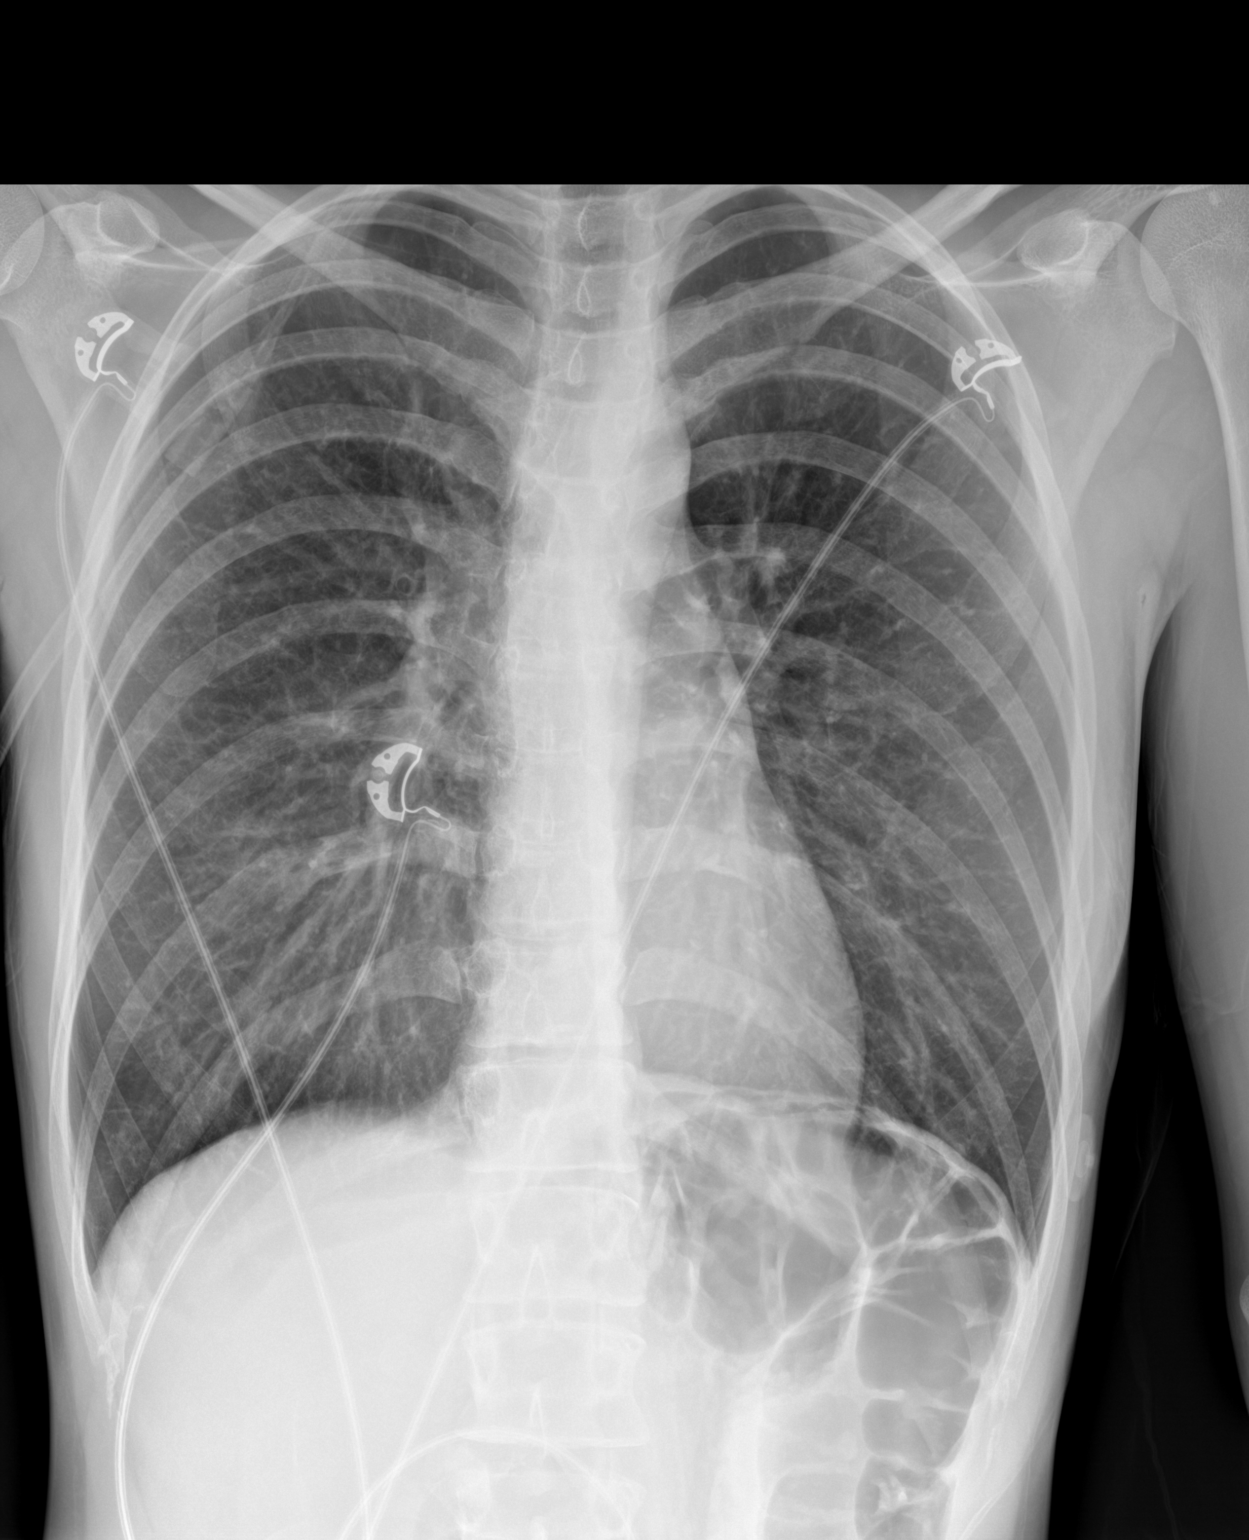

[1 of 1 positions shown; findings below may reference images not displayed]

FINDINGS: The heart size and mediastinal contours are within normal limits.
Both lungs are clear. No pneumothorax or pleural effusion is noted.
The visualized skeletal structures are unremarkable.
IMPRESSION: No active disease.
